# Patient Record
Sex: Male | Born: 1990 | Race: White | Hispanic: No | Marital: Single | Smoking: Current every day smoker
Health system: Southern US, Community
[De-identification: ages and names within clinical notes are randomized; demographics above are authoritative.]

## PROBLEM LIST (undated history)

## (undated) DIAGNOSIS — F845 Asperger's syndrome: Secondary | ICD-10-CM

## (undated) DIAGNOSIS — F909 Attention-deficit hyperactivity disorder, unspecified type: Secondary | ICD-10-CM

## (undated) DIAGNOSIS — J45909 Unspecified asthma, uncomplicated: Secondary | ICD-10-CM

---

## 2017-11-04 ENCOUNTER — Encounter (HOSPITAL_COMMUNITY): Payer: Self-pay | Admitting: Emergency Medicine

## 2017-11-04 ENCOUNTER — Other Ambulatory Visit: Payer: Self-pay

## 2017-11-04 ENCOUNTER — Emergency Department (HOSPITAL_COMMUNITY)
Admission: EM | Admit: 2017-11-04 | Discharge: 2017-11-04 | Disposition: A | Discharge status: Home / Self Care | Payer: Self-pay | Attending: Emergency Medicine | Admitting: Emergency Medicine

## 2017-11-04 DIAGNOSIS — Z760 Encounter for issue of repeat prescription: Secondary | ICD-10-CM | POA: Insufficient documentation

## 2017-11-04 DIAGNOSIS — F17210 Nicotine dependence, cigarettes, uncomplicated: Secondary | ICD-10-CM | POA: Insufficient documentation

## 2017-11-04 DIAGNOSIS — J45909 Unspecified asthma, uncomplicated: Secondary | ICD-10-CM | POA: Insufficient documentation

## 2017-11-04 DIAGNOSIS — F909 Attention-deficit hyperactivity disorder, unspecified type: Secondary | ICD-10-CM | POA: Insufficient documentation

## 2017-11-04 DIAGNOSIS — F845 Asperger's syndrome: Secondary | ICD-10-CM | POA: Insufficient documentation

## 2017-11-04 HISTORY — DX: Asperger's syndrome: F84.5

## 2017-11-04 HISTORY — DX: Unspecified asthma, uncomplicated: J45.909

## 2017-11-04 HISTORY — DX: Attention-deficit hyperactivity disorder, unspecified type: F90.9

## 2017-11-04 MED ORDER — RISPERIDONE 1 MG PO TABS: 1.0000 mg | ORAL_TABLET | Freq: Two times a day (BID) | ORAL | 0 refills | Status: AC

## 2017-11-04 NOTE — Discharge Instructions (Signed)
You can contact one of the providers listed to establish care.    [facility]    Kari BaarsEdward Hawkins MD. Specialty: Pulmonary Disease Contact information: [address]  [address]  [place] [address]  [phone]   Syliva OvermanMargaret Simpson, MD. Specialty: [facility] Contact information: [address]  [place] [address]  [phone]   Lilyan PuntScott Luking, MD. Specialty: [facility] Contact information: [address]  [address]  [place] [address]  [phone]   Avon Gullyesfaye Fanta, MD Specialty: Internal Medicine Contact information: [address]  [place] [address]  [phone]   Catalina PizzaZach Hall, MD. Specialty: Internal Medicine Contact information: [address]  [place] [address]  [phone]    [facility] (Dr. Selena BattenKim) Specialty: Family Medicine Contact information: [address]  [place] [address]  [phone]   John GiovanniStephen Knowlton, MD. Specialty: [facility] Contact information: [address]  [address]  [place] [address]  [phone]   Carylon Perchesoy Fagan, MD. Specialty: Internal Medicine Contact information: [address]  [address]  [place] [address]  [phone]    [facility]  [address] [place] [address] [phone]  Services [facility] offers a variety of basic health services.  Services include but are not limited to: Blood pressure checks  Heart rate checks  Blood sugar checks  Urine analysis  Rapid strep tests  Pregnancy tests.  Health education and referrals  People needing more complex services will be directed to a physician online. Using these virtual visits, doctors can evaluate and prescribe medicine and treatments. There will be no medication on-site, though [facility] will help patients fill their prescriptions at little to no cost.   For More information please  go to: [email or web address]

## 2017-11-04 NOTE — ED Triage Notes (Signed)
Pt states he moved here this weekend and is running low on psych meds. Pt states he takes risperidone 1mg  twice daily.

## 2017-11-07 NOTE — ED Provider Notes (Signed)
[facility] EMERGENCY DEPARTMENT Provider Note   CSN: [id number] Arrival date & time: 11/04/17  1902     History   Chief Complaint Chief Complaint  Patient presents with  . Medication Refill    HPI Victor Keller is a 26 y.o. male.  HPI  Victor Keller is a [age] y.o. male who presents to the Emergency Department requesting a refill of his Risperdal.  States that he recently moved to the area from [place] and has not been able to establish mental health care.  States he has not missed any doses, but only has enough for one more day.  Denies any symptoms at present.  No SI or HI.    Past Medical History:  Diagnosis Date  . ADHD   . Asperger syndrome   . Asthma     There are no active problems to display for this patient.   History reviewed. No pertinent surgical history.     Home Medications    Prior to Admission medications   Medication Sig Start Date End Date Taking? Authorizing Provider  risperiDONE (RISPERDAL) 1 MG tablet Take 1 tablet (1 mg total) 2 (two) times daily by mouth. 11/04/17   Pauline Ausriplett, Tomothy Eddins, PA-C    Family History History reviewed. No pertinent family history.  Social History Social History   Tobacco Use  . Smoking status: Current Every Day Smoker  . Smokeless tobacco: Current User  Substance Use Topics  . Alcohol use: No    Frequency: Never  . Drug use: No     Allergies   Contrast media [iodinated diagnostic agents]   Review of Systems Review of Systems  Constitutional: Negative for chills, fatigue and fever.  Respiratory: Negative for cough and shortness of breath.   Cardiovascular: Negative for chest pain and palpitations.  Gastrointestinal: Negative for nausea and vomiting.  Musculoskeletal: Negative for arthralgias, back pain, myalgias, neck pain and neck stiffness.  Skin: Negative for rash.  Neurological: Negative for dizziness, speech difficulty, weakness, numbness and headaches.  Hematological: Does not bruise/bleed easily.   Psychiatric/Behavioral: Negative for agitation, confusion and suicidal ideas.     Physical Exam Updated Vital Signs BP 134/89 (BP Location: Right Arm)   Pulse 70   Temp 98 F (36.7 C) (Oral)   Resp 18   Ht 6\' 1"  (1.854 m)   Wt 99.8 kg (220 lb)   SpO2 98%   BMI 29.03 kg/m   Physical Exam  Constitutional: He is oriented to person, place, and time. He appears well-developed and well-nourished. No distress.  HENT:  Head: Atraumatic.  Mouth/Throat: Oropharynx is clear and moist.  Neck: Normal range of motion.  Cardiovascular: Normal rate, regular rhythm and intact distal pulses.  Pulmonary/Chest: Effort normal and breath sounds normal. No respiratory distress.  Musculoskeletal: Normal range of motion.  Neurological: He is alert and oriented to person, place, and time. No sensory deficit.  Skin: Skin is warm. Capillary refill takes less than 2 seconds.  Psychiatric: He has a normal mood and affect. His behavior is normal. Judgment and thought content normal.  Nursing note and vitals reviewed.    ED Treatments / Results  Labs (all labs ordered are listed, but only abnormal results are displayed) Labs Reviewed - No data to display  EKG  EKG Interpretation None       Radiology No results found.  Procedures Procedures (including critical care time)  Medications Ordered in ED Medications - No data to display   Initial Impression / Assessment and Plan /  ED Course  I have reviewed the triage vital signs and the nursing notes.  Pertinent labs & imaging results that were available during my care of the patient were reviewed by me and considered in my medical decision making (see chart for details).     Pt well appearing, no SI or HI.  Denies symptoms. Will give short course of medication with understanding that he will arrange psychiatrist f/u.  Referral resources given.    Final Clinical Impressions(s) / ED Diagnoses   Final diagnoses:  Encounter for medication  refill    ED Discharge Orders        Ordered    risperiDONE (RISPERDAL) 1 MG tablet  2 times daily     11/04/17 2021       Pauline Ausriplett, Ramina Hulet, PA-C 11/07/17 1342    Bethann BerkshireZammit, Joseph, MD 11/11/17 1229

## 2018-01-24 ENCOUNTER — Other Ambulatory Visit: Payer: Self-pay

## 2018-01-24 ENCOUNTER — Emergency Department (HOSPITAL_COMMUNITY): Payer: Medicaid Other

## 2018-01-24 ENCOUNTER — Encounter (HOSPITAL_COMMUNITY): Payer: Self-pay | Admitting: Emergency Medicine

## 2018-01-24 ENCOUNTER — Emergency Department (HOSPITAL_COMMUNITY)
Admission: EM | Admit: 2018-01-24 | Discharge: 2018-01-24 | Disposition: A | Discharge status: Home / Self Care | Payer: Medicaid Other | Attending: Emergency Medicine | Admitting: Emergency Medicine

## 2018-01-24 DIAGNOSIS — F845 Asperger's syndrome: Secondary | ICD-10-CM | POA: Insufficient documentation

## 2018-01-24 DIAGNOSIS — F17210 Nicotine dependence, cigarettes, uncomplicated: Secondary | ICD-10-CM | POA: Insufficient documentation

## 2018-01-24 DIAGNOSIS — Z043 Encounter for examination and observation following other accident: Secondary | ICD-10-CM | POA: Insufficient documentation

## 2018-01-24 DIAGNOSIS — Z23 Encounter for immunization: Secondary | ICD-10-CM | POA: Insufficient documentation

## 2018-01-24 DIAGNOSIS — M25522 Pain in left elbow: Secondary | ICD-10-CM | POA: Insufficient documentation

## 2018-01-24 DIAGNOSIS — Z79899 Other long term (current) drug therapy: Secondary | ICD-10-CM | POA: Insufficient documentation

## 2018-01-24 DIAGNOSIS — M7918 Myalgia, other site: Secondary | ICD-10-CM

## 2018-01-24 DIAGNOSIS — M791 Myalgia, unspecified site: Secondary | ICD-10-CM | POA: Insufficient documentation

## 2018-01-24 DIAGNOSIS — J45909 Unspecified asthma, uncomplicated: Secondary | ICD-10-CM | POA: Insufficient documentation

## 2018-01-24 DIAGNOSIS — M79652 Pain in left thigh: Secondary | ICD-10-CM | POA: Insufficient documentation

## 2018-01-24 MED ORDER — TETANUS-DIPHTH-ACELL PERTUSSIS 5-2.5-18.5 LF-MCG/0.5 IM SUSP
0.5000 mL | Freq: Once | INTRAMUSCULAR | Status: AC
  Administered 2018-01-24: 0.5 mL via INTRAMUSCULAR
  Filled 2018-01-24: qty 0.5

## 2018-01-24 MED ORDER — IBUPROFEN 800 MG PO TABS
800.0000 mg | ORAL_TABLET | Freq: Once | ORAL | Status: AC
  Administered 2018-01-24: 800 mg via ORAL
  Filled 2018-01-24: qty 1

## 2018-01-24 MED ORDER — IBUPROFEN 800 MG PO TABS: 800.0000 mg | ORAL_TABLET | Freq: Three times a day (TID) | ORAL | 0 refills | Status: DC
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
## 2018-01-24 NOTE — ED Notes (Signed)
Ice pack applied to left thigh for pain and swelling

## 2018-04-15 ENCOUNTER — Encounter (HOSPITAL_COMMUNITY): Payer: Self-pay | Admitting: Emergency Medicine

## 2018-04-15 ENCOUNTER — Emergency Department (HOSPITAL_COMMUNITY): Payer: No Typology Code available for payment source

## 2018-04-15 ENCOUNTER — Emergency Department (HOSPITAL_COMMUNITY)
Admission: EM | Admit: 2018-04-15 | Discharge: 2018-04-15 | Disposition: A | Discharge status: Home / Self Care | Payer: No Typology Code available for payment source | Attending: Emergency Medicine | Admitting: Emergency Medicine

## 2018-04-15 DIAGNOSIS — Y999 Unspecified external cause status: Secondary | ICD-10-CM | POA: Insufficient documentation

## 2018-04-15 DIAGNOSIS — F17210 Nicotine dependence, cigarettes, uncomplicated: Secondary | ICD-10-CM | POA: Insufficient documentation

## 2018-04-15 DIAGNOSIS — Y9389 Activity, other specified: Secondary | ICD-10-CM | POA: Diagnosis not present

## 2018-04-15 DIAGNOSIS — Y92410 Unspecified street and highway as the place of occurrence of the external cause: Secondary | ICD-10-CM | POA: Diagnosis not present

## 2018-04-15 DIAGNOSIS — S82122A Displaced fracture of lateral condyle of left tibia, initial encounter for closed fracture: Secondary | ICD-10-CM | POA: Diagnosis not present

## 2018-04-15 DIAGNOSIS — S8992XA Unspecified injury of left lower leg, initial encounter: Secondary | ICD-10-CM | POA: Diagnosis present

## 2018-04-15 DIAGNOSIS — J45909 Unspecified asthma, uncomplicated: Secondary | ICD-10-CM | POA: Diagnosis not present

## 2018-04-15 DIAGNOSIS — S82142A Displaced bicondylar fracture of left tibia, initial encounter for closed fracture: Secondary | ICD-10-CM

## 2018-04-15 MED ORDER — HYDROCODONE-ACETAMINOPHEN 5-325 MG PO TABS
2.0000 | ORAL_TABLET | Freq: Once | ORAL | Status: AC
  Administered 2018-04-15: 2 via ORAL
  Filled 2018-04-15: qty 2

## 2018-04-15 MED ORDER — IBUPROFEN 600 MG PO TABS: 600.0000 mg | ORAL_TABLET | Freq: Four times a day (QID) | ORAL | 0 refills | Status: AC | PRN

## 2018-04-15 MED ORDER — BACITRACIN ZINC 500 UNIT/GM EX OINT
1.0000 "application " | TOPICAL_OINTMENT | Freq: Two times a day (BID) | CUTANEOUS | Status: DC
  Administered 2018-04-15: 1 via TOPICAL
  Filled 2018-04-15: qty 0.9

## 2018-04-15 MED ORDER — HYDROCODONE-ACETAMINOPHEN 5-325 MG PO TABS: 1.0000 | ORAL_TABLET | Freq: Four times a day (QID) | ORAL | 0 refills | Status: AC | PRN

## 2018-04-15 NOTE — Discharge Instructions (Signed)
You have a fracture of your knee on the left, please use the crutches and keep the brace on your knee until you follow-up with the orthopedic doctor.  I have given you the phone number above for Dr. August Saucerean who is on-call today.  Please call first thing in the morning to make an appointment in the office.  You may be seen later this week or first thing next week depending on availability.  Hydrocodone for severe pain, if you take this medication take a stool softener.  Ibuprofen 3 times a day as well.  Ice and elevate your knee.  Please avoid ambulating on this leg if possible

## 2018-04-15 NOTE — ED Notes (Signed)
[facility] Dr August Saucerean is in surgery at this time. Dr Hyacinth MeekerMiller made aware and given Cell number to call.
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
## 2018-04-15 NOTE — ED Notes (Signed)
Pt to xray

## 2018-04-21 ENCOUNTER — Ambulatory Visit (INDEPENDENT_AMBULATORY_CARE_PROVIDER_SITE_OTHER): Payer: Self-pay | Admitting: Physician Assistant

## 2018-04-22 ENCOUNTER — Ambulatory Visit (INDEPENDENT_AMBULATORY_CARE_PROVIDER_SITE_OTHER): Payer: Self-pay | Admitting: Physician Assistant

## 2018-04-25 ENCOUNTER — Encounter (INDEPENDENT_AMBULATORY_CARE_PROVIDER_SITE_OTHER): Payer: Self-pay | Admitting: Physician Assistant

## 2018-04-25 ENCOUNTER — Ambulatory Visit (INDEPENDENT_AMBULATORY_CARE_PROVIDER_SITE_OTHER): Payer: Self-pay | Admitting: Physician Assistant

## 2018-04-25 DIAGNOSIS — S82142A Displaced bicondylar fracture of left tibia, initial encounter for closed fracture: Secondary | ICD-10-CM

## 2018-04-25 MED ORDER — HYDROCODONE-ACETAMINOPHEN 5-325 MG PO TABS: 1.0000 | ORAL_TABLET | Freq: Two times a day (BID) | ORAL | 0 refills | Status: AC | PRN

## 2018-04-25 MED ORDER — IBUPROFEN 600 MG PO TABS: 600.0000 mg | ORAL_TABLET | Freq: Three times a day (TID) | ORAL | 1 refills | Status: AC | PRN

## 2018-04-25 MED ORDER — MUPIROCIN 2 % EX OINT: TOPICAL_OINTMENT | CUTANEOUS | 0 refills | Status: AC
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
## 2018-04-29 ENCOUNTER — Telehealth (INDEPENDENT_AMBULATORY_CARE_PROVIDER_SITE_OTHER): Payer: Self-pay | Admitting: Orthopaedic Surgery

## 2018-04-29 NOTE — Telephone Encounter (Signed)
Pt scheduled at [facility] on Tues May 7 pt is aware of appt

## 2018-04-29 NOTE — Telephone Encounter (Signed)
Patient called to check the status of his CT.  I advised the patient that the procedure would be at [facility].  He then requested that the order be sent over to [facility] in [place].  Please advise pt.  [phone].  Thank you.

## 2018-05-06 ENCOUNTER — Ambulatory Visit (HOSPITAL_COMMUNITY)
Admission: RE | Admit: 2018-05-06 | Discharge: 2018-05-06 | Disposition: A | Discharge status: Home / Self Care | Payer: Self-pay | Source: Ambulatory Visit | Attending: Physician Assistant | Admitting: Physician Assistant

## 2018-05-06 ENCOUNTER — Ambulatory Visit (HOSPITAL_COMMUNITY): Payer: No Typology Code available for payment source

## 2018-05-06 DIAGNOSIS — S82142A Displaced bicondylar fracture of left tibia, initial encounter for closed fracture: Secondary | ICD-10-CM | POA: Insufficient documentation

## 2018-05-06 DIAGNOSIS — M84364A Stress fracture, left fibula, initial encounter for fracture: Secondary | ICD-10-CM | POA: Insufficient documentation

## 2018-05-06 DIAGNOSIS — M25462 Effusion, left knee: Secondary | ICD-10-CM | POA: Insufficient documentation

## 2018-05-07 NOTE — Progress Notes (Signed)
Can you have  him come in to see [place] tomorrow

## 2018-05-08 ENCOUNTER — Ambulatory Visit (INDEPENDENT_AMBULATORY_CARE_PROVIDER_SITE_OTHER): Payer: Self-pay | Admitting: Orthopaedic Surgery

## 2018-05-08 DIAGNOSIS — S82142A Displaced bicondylar fracture of left tibia, initial encounter for closed fracture: Secondary | ICD-10-CM

## 2018-05-08 NOTE — Progress Notes (Signed)
ok 

## 2018-05-08 NOTE — Progress Notes (Signed)
   Office Visit Note   Patient: Victor Keller           Date of Birth: 1991-01-05           MRN: [id number] Visit Date: 05/08/2018              Requested by: No referring provider defined for this encounter. PCP: Default, Provider, MD   Assessment & Plan: Visit Diagnoses:  1. Closed fracture of left tibial plateau, initial encounter     Plan: Impression is non-displaced lateral tibial plateau fracture with slight widening of the tibial plateau.  Patient has been noncompliant with weightbearing restrictions.  I stressed the importance of being nonweightbearing.  I get the sense that the patient will continue to be noncompliant.  I will see him back in 2 weeks with a repeat 2 view x-rays left knee.  Sedentary work note was provided today.  Follow-Up Instructions: Return in about 2 weeks (around 05/22/2018).   Orders:  No orders of the defined types were placed in this encounter.  No orders of the defined types were placed in this encounter.     Procedures: No procedures performed   Clinical Data: No additional findings.   Subjective: Chief Complaint  Patient presents with  . Left Knee - Pain    Victor Keller comes in today for CT scan review of his left knee.  He has been noncompliant with weightbearing restrictions.   Review of Systems   Objective: Vital Signs: There were no vitals taken for this visit.  Physical Exam  Ortho Exam Left knee exam shows mild joint effusion.  Range of motion is improving.  Collaterals and cruciates are grossly intact. Specialty Comments:  No specialty comments available.  Imaging: No results found.   PMFS History: Patient Active Problem List   Diagnosis Date Noted  . Closed fracture of left tibial plateau 04/25/2018   Past Medical History:  Diagnosis Date  . ADHD   . Asperger syndrome   . Asthma     No family history on file.  No past surgical history on file. Social History   Occupational History  . Not on file    Tobacco Use  . Smoking status: Current Every Day Smoker    Types: Cigarettes  . Smokeless tobacco: Never Used  Substance and Sexual Activity  . Alcohol use: Yes    Frequency: Never    Comment: occasionally  . Drug use: No  . Sexual activity: Not on file

## 2018-05-13 ENCOUNTER — Ambulatory Visit (INDEPENDENT_AMBULATORY_CARE_PROVIDER_SITE_OTHER): Payer: Self-pay | Admitting: Orthopaedic Surgery
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
